# Patient Record
Sex: Female | Born: 1970 | Race: White | Hispanic: No | Marital: Married | Smoking: Never smoker
Health system: Southern US, Community
[De-identification: ages and names within clinical notes are randomized; demographics above are authoritative.]

## PROBLEM LIST (undated history)

## (undated) DIAGNOSIS — E079 Disorder of thyroid, unspecified: Secondary | ICD-10-CM

## (undated) HISTORY — PX: TYMPANOSTOMY TUBE PLACEMENT: SHX32

## (undated) HISTORY — DX: Disorder of thyroid, unspecified: E07.9

## (undated) HISTORY — PX: HYSTERECTOMY ABDOMINAL WITH SALPINGO-OOPHORECTOMY: SHX6792

---

## 2012-05-24 ENCOUNTER — Ambulatory Visit: Payer: BC Managed Care – PPO | Attending: Podiatry | Admitting: Physical Therapy

## 2012-05-24 DIAGNOSIS — M25676 Stiffness of unspecified foot, not elsewhere classified: Secondary | ICD-10-CM | POA: Insufficient documentation

## 2012-05-24 DIAGNOSIS — M25673 Stiffness of unspecified ankle, not elsewhere classified: Secondary | ICD-10-CM | POA: Insufficient documentation

## 2012-05-24 DIAGNOSIS — IMO0001 Reserved for inherently not codable concepts without codable children: Secondary | ICD-10-CM | POA: Insufficient documentation

## 2012-05-24 DIAGNOSIS — M25579 Pain in unspecified ankle and joints of unspecified foot: Secondary | ICD-10-CM | POA: Insufficient documentation

## 2012-05-30 ENCOUNTER — Ambulatory Visit: Payer: BC Managed Care – PPO | Admitting: Physical Therapy

## 2012-06-01 ENCOUNTER — Ambulatory Visit: Payer: BC Managed Care – PPO | Admitting: Physical Therapy

## 2012-06-06 ENCOUNTER — Ambulatory Visit: Payer: BC Managed Care – PPO | Admitting: Physical Therapy

## 2012-06-09 ENCOUNTER — Ambulatory Visit: Payer: BC Managed Care – PPO | Admitting: Physical Therapy

## 2012-06-13 ENCOUNTER — Ambulatory Visit: Payer: BC Managed Care – PPO | Admitting: Physical Therapy

## 2012-06-16 ENCOUNTER — Ambulatory Visit: Payer: BC Managed Care – PPO | Admitting: Physical Therapy

## 2012-06-20 ENCOUNTER — Ambulatory Visit: Payer: BC Managed Care – PPO | Attending: Podiatry | Admitting: Physical Therapy

## 2012-06-20 DIAGNOSIS — M25579 Pain in unspecified ankle and joints of unspecified foot: Secondary | ICD-10-CM | POA: Insufficient documentation

## 2012-06-20 DIAGNOSIS — IMO0001 Reserved for inherently not codable concepts without codable children: Secondary | ICD-10-CM | POA: Insufficient documentation

## 2012-06-20 DIAGNOSIS — M25676 Stiffness of unspecified foot, not elsewhere classified: Secondary | ICD-10-CM | POA: Insufficient documentation

## 2012-06-20 DIAGNOSIS — M25673 Stiffness of unspecified ankle, not elsewhere classified: Secondary | ICD-10-CM | POA: Insufficient documentation

## 2012-06-21 ENCOUNTER — Encounter: Payer: BC Managed Care – PPO | Admitting: Physical Therapy

## 2012-06-26 ENCOUNTER — Ambulatory Visit: Payer: BC Managed Care – PPO | Admitting: Physical Therapy

## 2012-06-28 ENCOUNTER — Ambulatory Visit: Payer: BC Managed Care – PPO | Admitting: Physical Therapy

## 2019-01-25 DIAGNOSIS — L918 Other hypertrophic disorders of the skin: Secondary | ICD-10-CM | POA: Diagnosis not present

## 2019-01-25 DIAGNOSIS — B351 Tinea unguium: Secondary | ICD-10-CM | POA: Diagnosis not present

## 2019-06-04 DIAGNOSIS — E063 Autoimmune thyroiditis: Secondary | ICD-10-CM | POA: Diagnosis not present

## 2019-06-04 DIAGNOSIS — Z8719 Personal history of other diseases of the digestive system: Secondary | ICD-10-CM | POA: Diagnosis not present

## 2019-06-04 DIAGNOSIS — Z79899 Other long term (current) drug therapy: Secondary | ICD-10-CM | POA: Diagnosis not present

## 2019-06-04 DIAGNOSIS — E038 Other specified hypothyroidism: Secondary | ICD-10-CM | POA: Diagnosis not present

## 2019-06-04 DIAGNOSIS — Z833 Family history of diabetes mellitus: Secondary | ICD-10-CM | POA: Diagnosis not present

## 2019-06-04 DIAGNOSIS — E663 Overweight: Secondary | ICD-10-CM | POA: Diagnosis not present

## 2019-06-04 DIAGNOSIS — E559 Vitamin D deficiency, unspecified: Secondary | ICD-10-CM | POA: Diagnosis not present

## 2019-06-04 DIAGNOSIS — K2970 Gastritis, unspecified, without bleeding: Secondary | ICD-10-CM | POA: Diagnosis not present

## 2019-06-06 DIAGNOSIS — R1011 Right upper quadrant pain: Secondary | ICD-10-CM | POA: Diagnosis not present

## 2019-06-06 DIAGNOSIS — R110 Nausea: Secondary | ICD-10-CM | POA: Diagnosis not present

## 2019-06-06 DIAGNOSIS — R1013 Epigastric pain: Secondary | ICD-10-CM | POA: Diagnosis not present

## 2019-06-06 DIAGNOSIS — K219 Gastro-esophageal reflux disease without esophagitis: Secondary | ICD-10-CM | POA: Diagnosis not present

## 2019-06-14 DIAGNOSIS — K7689 Other specified diseases of liver: Secondary | ICD-10-CM | POA: Diagnosis not present

## 2019-06-14 DIAGNOSIS — R1013 Epigastric pain: Secondary | ICD-10-CM | POA: Diagnosis not present

## 2019-06-14 DIAGNOSIS — K824 Cholesterolosis of gallbladder: Secondary | ICD-10-CM | POA: Diagnosis not present

## 2019-06-18 DIAGNOSIS — K2960 Other gastritis without bleeding: Secondary | ICD-10-CM | POA: Diagnosis not present

## 2019-06-18 DIAGNOSIS — Z7689 Persons encountering health services in other specified circumstances: Secondary | ICD-10-CM | POA: Diagnosis not present

## 2019-07-16 DIAGNOSIS — K2100 Gastro-esophageal reflux disease with esophagitis, without bleeding: Secondary | ICD-10-CM | POA: Diagnosis not present

## 2019-07-16 DIAGNOSIS — E663 Overweight: Secondary | ICD-10-CM | POA: Diagnosis not present

## 2019-08-13 DIAGNOSIS — K2100 Gastro-esophageal reflux disease with esophagitis, without bleeding: Secondary | ICD-10-CM | POA: Diagnosis not present

## 2019-08-13 DIAGNOSIS — Z7689 Persons encountering health services in other specified circumstances: Secondary | ICD-10-CM | POA: Diagnosis not present

## 2019-08-14 DIAGNOSIS — E038 Other specified hypothyroidism: Secondary | ICD-10-CM | POA: Diagnosis not present

## 2019-08-14 DIAGNOSIS — E063 Autoimmune thyroiditis: Secondary | ICD-10-CM | POA: Diagnosis not present

## 2019-08-20 ENCOUNTER — Emergency Department (INDEPENDENT_AMBULATORY_CARE_PROVIDER_SITE_OTHER)
Admission: EM | Admit: 2019-08-20 | Discharge: 2019-08-20 | Disposition: A | Discharge status: Home / Self Care | Payer: BC Managed Care – PPO | Source: Home / Self Care | Attending: Family Medicine | Admitting: Family Medicine

## 2019-08-20 ENCOUNTER — Other Ambulatory Visit: Payer: Self-pay

## 2019-08-20 DIAGNOSIS — U071 COVID-19: Secondary | ICD-10-CM

## 2019-08-20 LAB — POC SARS CORONAVIRUS 2 AG -  ED: SARS Coronavirus 2 Ag: POSITIVE — AB

## 2019-08-20 NOTE — Discharge Instructions (Addendum)
Take plain guaifenesin with plenty of water, for cough and congestion.  May add Pseudoephedrine (75m, one or two every 4 to 6 hours) for sinus congestion.  Get adequate rest.   May use Afrin nasal spray (or generic oxymetazoline) each morning for about 5 days and then discontinue.  Also recommend using saline nasal spray several times daily and saline nasal irrigation (AYR is a common brand).  Use Flonase nasal spray each morning after using Afrin nasal spray and saline nasal irrigation. Try warm salt water gargles for sore throat.  Stop all antihistamines for now, and other non-prescription cough/cold preparations. May take Ibuprofen 2063m 4 tabs every 8 hours with food for body aches, headache, etc. May take Delsym Cough Suppressant at bedtime for nighttime cough.   Isolate yourself until the below conditions are met: 1)  At least 7 days since symptoms onset. AND 2)  > 72 hours after symptom resolution (absence of fever without the use of fever-reducing medicine, and improvement in respiratory symptoms.

## 2019-08-20 NOTE — ED Provider Notes (Signed)
[facility]    CSN: [id number] Arrival date & time: 08/20/19  1144      History   Chief Complaint Chief Complaint  Patient presents with  . Fever  . Facial Pain    HPI Natividad Faulk is a 48 y.o. female.   Patient reports that she developed sinus congestion about two weeks ago but did not feel ill.  During the past week she has developed low grade fever with chills, sensitive skin, mild fatigue, and mild cough.  She also has developed loose stools, and believes that her ability to taste has changed.   She denies chest tightness and shortness of breath. Her husband became ill about 8 days ago and had a positive COVID19 test three days ago.    The history is provided by the patient.    History reviewed. No pertinent past medical history.  There are no active problems to display for this patient.   History reviewed. No pertinent surgical history.  OB History   No obstetric history on file.      Home Medications    Prior to Admission medications   Medication Sig Start Date End Date Taking? Authorizing Provider  estradiol (VIVELLE-DOT) 0.05 MG/24HR patch Place one patch on skin two times per week. 10/10/18  Yes [provider]  SYNTHROID 137 MCG tablet Take 137 mcg by mouth daily. 08/02/19   [provider]    Family History History reviewed. No pertinent family history.  Social History Social History   Tobacco Use  . Smoking status: Never Smoker  . Smokeless tobacco: Never Used  Substance Use Topics  . Alcohol use: Not on file  . Drug use: Not on file     Allergies   Levaquin [levofloxacin]   Review of Systems Review of Systems No sore throat + cough No pleuritic pain No wheezing + nasal congestion ? post-nasal drainage No sinus pain/pressure No itchy/red eyes No earache No hemoptysis No SOB No fever, + chills No nausea No vomiting No abdominal pain + mild diarrhea No urinary symptoms No skin rash + fatigue No  myalgias No headache Used OTC meds without relief   Physical Exam Triage Vital Signs ED Triage Vitals  Enc Vitals Group     BP 08/20/19 1228 (!) 139/92     Pulse Rate 08/20/19 1228 75     Resp 08/20/19 1228 18     Temp 08/20/19 1228 97.7 F (36.5 C)     Temp Source 08/20/19 1228 Oral     SpO2 08/20/19 1228 100 %     Weight --      Height --      Head Circumference --      Peak Flow --      Pain Score 08/20/19 1229 0     Pain Loc --      Pain Edu? --      Excl. in [place]? --    No data found.  Updated Vital Signs BP (!) 139/92 (BP Location: Left Arm)   Pulse 75   Temp 97.7 F (36.5 C) (Oral)   Resp 18   LMP  (LMP Unknown)   SpO2 100%   Visual Acuity Right Eye Distance:   Left Eye Distance:   Bilateral Distance:    Right Eye Near:   Left Eye Near:    Bilateral Near:     Physical Exam Nursing notes and Vital Signs reviewed. Appearance:  Patient appears stated age, and in no acute distress Eyes:  Pupils are equal, round, and reactive to light and accomodation.  Extraocular movement is intact.  Conjunctivae are not inflamed  Ears:  Canals normal.  Tympanic membranes normal.  Nose:  Mildly congested turbinates.  No sinus tenderness.  Pharynx:  Normal Neck:  Supple.  Mildly enlarged nontender lateral nodes are present.  Lungs:  Clear to auscultation.  Breath sounds are equal.  Moving air well. Heart:  Regular rate and rhythm without murmurs, rubs, or gallops.  Abdomen:  Nontender without masses or hepatosplenomegaly.  Bowel sounds are present.  No CVA or flank tenderness.  Extremities:  No edema.  Skin:  No rash present.   UC Treatments / Results  Labs (all labs ordered are listed, but only abnormal results are displayed) Labs Reviewed  POC SARS CORONAVIRUS 2 AG -  ED - Abnormal; Notable for the following components:      Result Value   SARS Coronavirus 2 Ag Positive (*)    All other components within normal limits    EKG   Radiology No results found.   Procedures Procedures (including critical care time)  Medications Ordered in UC Medications - No data to display  Initial Impression / Assessment and Plan / UC Course  I have reviewed the triage vital signs and the nursing notes.  Pertinent labs & imaging results that were available during my care of the patient were reviewed by me and considered in my medical decision making (see chart for details).    There is no evidence of bacterial infection today.  Treat symptomatically for now  If symptoms become significantly worse during the night or over the weekend, proceed to the local emergency room.   Final Clinical Impressions(s) / UC Diagnoses   Final diagnoses:  [id number] virus infection     Discharge Instructions     Take plain guaifenesin with plenty of water, for cough and congestion.  May add Pseudoephedrine (63m, one or two every 4 to 6 hours) for sinus congestion.  Get adequate rest.   May use Afrin nasal spray (or generic oxymetazoline) each morning for about 5 days and then discontinue.  Also recommend using saline nasal spray several times daily and saline nasal irrigation (AYR is a common brand).  Use Flonase nasal spray each morning after using Afrin nasal spray and saline nasal irrigation. Try warm salt water gargles for sore throat.  Stop all antihistamines for now, and other non-prescription cough/cold preparations. May take Ibuprofen 2060m 4 tabs every 8 hours with food for body aches, headache, etc. May take Delsym Cough Suppressant at bedtime for nighttime cough.   Isolate yourself until the below conditions are met: 1)  At least 7 days since symptoms onset. AND 2)  > 72 hours after symptom resolution (absence of fever without the use of fever-reducing medicine, and improvement in respiratory symptoms.        ED Prescriptions    None        BeKandra NicolasMD 08/23/19 1143

## 2019-08-20 NOTE — ED Triage Notes (Signed)
Pt c/o sinus issues x 2 weeks. Low grade fever and cough since Thurs. Husband tested pos for covid on Friday.

## 2019-09-25 DIAGNOSIS — Z1231 Encounter for screening mammogram for malignant neoplasm of breast: Secondary | ICD-10-CM | POA: Diagnosis not present

## 2019-10-03 DIAGNOSIS — N6011 Diffuse cystic mastopathy of right breast: Secondary | ICD-10-CM | POA: Diagnosis not present

## 2019-10-03 DIAGNOSIS — N6001 Solitary cyst of right breast: Secondary | ICD-10-CM | POA: Diagnosis not present

## 2019-10-03 DIAGNOSIS — R922 Inconclusive mammogram: Secondary | ICD-10-CM | POA: Diagnosis not present

## 2019-10-12 DIAGNOSIS — Z90710 Acquired absence of both cervix and uterus: Secondary | ICD-10-CM | POA: Diagnosis not present

## 2019-10-12 DIAGNOSIS — Z01419 Encounter for gynecological examination (general) (routine) without abnormal findings: Secondary | ICD-10-CM | POA: Diagnosis not present

## 2019-10-12 DIAGNOSIS — Z8541 Personal history of malignant neoplasm of cervix uteri: Secondary | ICD-10-CM | POA: Diagnosis not present

## 2019-10-12 DIAGNOSIS — N951 Menopausal and female climacteric states: Secondary | ICD-10-CM | POA: Diagnosis not present

## 2019-10-12 DIAGNOSIS — Z1272 Encounter for screening for malignant neoplasm of vagina: Secondary | ICD-10-CM | POA: Diagnosis not present

## 2019-10-22 DIAGNOSIS — Z0000 Encounter for general adult medical examination without abnormal findings: Secondary | ICD-10-CM | POA: Diagnosis not present

## 2020-04-02 DIAGNOSIS — N6011 Diffuse cystic mastopathy of right breast: Secondary | ICD-10-CM | POA: Diagnosis not present

## 2020-04-02 DIAGNOSIS — R922 Inconclusive mammogram: Secondary | ICD-10-CM | POA: Diagnosis not present

## 2020-05-21 DIAGNOSIS — J300 Vasomotor rhinitis: Secondary | ICD-10-CM | POA: Diagnosis not present

## 2020-05-21 DIAGNOSIS — H6980 Other specified disorders of Eustachian tube, unspecified ear: Secondary | ICD-10-CM | POA: Diagnosis not present

## 2020-05-22 DIAGNOSIS — L821 Other seborrheic keratosis: Secondary | ICD-10-CM | POA: Diagnosis not present

## 2020-05-22 DIAGNOSIS — L814 Other melanin hyperpigmentation: Secondary | ICD-10-CM | POA: Diagnosis not present

## 2020-05-22 DIAGNOSIS — L700 Acne vulgaris: Secondary | ICD-10-CM | POA: Diagnosis not present

## 2020-05-22 DIAGNOSIS — L853 Xerosis cutis: Secondary | ICD-10-CM | POA: Diagnosis not present

## 2020-06-03 DIAGNOSIS — E038 Other specified hypothyroidism: Secondary | ICD-10-CM | POA: Diagnosis not present

## 2020-06-03 DIAGNOSIS — E559 Vitamin D deficiency, unspecified: Secondary | ICD-10-CM | POA: Diagnosis not present

## 2020-06-03 DIAGNOSIS — E063 Autoimmune thyroiditis: Secondary | ICD-10-CM | POA: Diagnosis not present

## 2020-06-03 DIAGNOSIS — Z833 Family history of diabetes mellitus: Secondary | ICD-10-CM | POA: Diagnosis not present

## 2020-09-18 DIAGNOSIS — L719 Rosacea, unspecified: Secondary | ICD-10-CM | POA: Diagnosis not present

## 2020-09-18 DIAGNOSIS — L814 Other melanin hyperpigmentation: Secondary | ICD-10-CM | POA: Diagnosis not present

## 2020-10-14 DIAGNOSIS — Z1272 Encounter for screening for malignant neoplasm of vagina: Secondary | ICD-10-CM | POA: Diagnosis not present

## 2020-10-14 DIAGNOSIS — N6001 Solitary cyst of right breast: Secondary | ICD-10-CM | POA: Diagnosis not present

## 2020-10-14 DIAGNOSIS — Z01419 Encounter for gynecological examination (general) (routine) without abnormal findings: Secondary | ICD-10-CM | POA: Diagnosis not present

## 2020-10-14 DIAGNOSIS — Z1231 Encounter for screening mammogram for malignant neoplasm of breast: Secondary | ICD-10-CM | POA: Diagnosis not present

## 2020-10-14 DIAGNOSIS — Z90710 Acquired absence of both cervix and uterus: Secondary | ICD-10-CM | POA: Diagnosis not present

## 2020-10-22 DIAGNOSIS — N6001 Solitary cyst of right breast: Secondary | ICD-10-CM | POA: Diagnosis not present

## 2020-10-22 DIAGNOSIS — R922 Inconclusive mammogram: Secondary | ICD-10-CM | POA: Diagnosis not present

## 2020-10-22 DIAGNOSIS — R928 Other abnormal and inconclusive findings on diagnostic imaging of breast: Secondary | ICD-10-CM | POA: Diagnosis not present

## 2020-10-23 DIAGNOSIS — Z1389 Encounter for screening for other disorder: Secondary | ICD-10-CM | POA: Diagnosis not present

## 2020-10-23 DIAGNOSIS — Z13220 Encounter for screening for lipoid disorders: Secondary | ICD-10-CM | POA: Diagnosis not present

## 2020-10-23 DIAGNOSIS — Z1329 Encounter for screening for other suspected endocrine disorder: Secondary | ICD-10-CM | POA: Diagnosis not present

## 2020-10-23 DIAGNOSIS — Z130 Encounter for screening for diseases of the blood and blood-forming organs and certain disorders involving the immune mechanism: Secondary | ICD-10-CM | POA: Diagnosis not present

## 2020-10-23 DIAGNOSIS — Z13228 Encounter for screening for other metabolic disorders: Secondary | ICD-10-CM | POA: Diagnosis not present

## 2020-10-23 DIAGNOSIS — Z0000 Encounter for general adult medical examination without abnormal findings: Secondary | ICD-10-CM | POA: Diagnosis not present

## 2020-10-23 DIAGNOSIS — E559 Vitamin D deficiency, unspecified: Secondary | ICD-10-CM | POA: Diagnosis not present

## 2021-01-05 ENCOUNTER — Ambulatory Visit (INDEPENDENT_AMBULATORY_CARE_PROVIDER_SITE_OTHER): Payer: BC Managed Care – PPO

## 2021-01-05 ENCOUNTER — Other Ambulatory Visit: Payer: Self-pay

## 2021-01-05 ENCOUNTER — Ambulatory Visit: Payer: BC Managed Care – PPO | Admitting: Sports Medicine

## 2021-01-05 DIAGNOSIS — M7989 Other specified soft tissue disorders: Secondary | ICD-10-CM | POA: Diagnosis not present

## 2021-01-05 DIAGNOSIS — Z09 Encounter for follow-up examination after completed treatment for conditions other than malignant neoplasm: Secondary | ICD-10-CM

## 2021-01-05 DIAGNOSIS — M2241 Chondromalacia patellae, right knee: Secondary | ICD-10-CM

## 2021-01-05 DIAGNOSIS — S8991XA Unspecified injury of right lower leg, initial encounter: Secondary | ICD-10-CM | POA: Diagnosis not present

## 2021-01-05 DIAGNOSIS — R600 Localized edema: Secondary | ICD-10-CM | POA: Diagnosis not present

## 2021-01-05 DIAGNOSIS — M1712 Unilateral primary osteoarthritis, left knee: Secondary | ICD-10-CM | POA: Diagnosis not present

## 2021-01-05 DIAGNOSIS — M1711 Unilateral primary osteoarthritis, right knee: Secondary | ICD-10-CM | POA: Diagnosis not present

## 2021-01-05 DIAGNOSIS — M7121 Synovial cyst of popliteal space [Baker], right knee: Secondary | ICD-10-CM | POA: Diagnosis not present

## 2021-01-05 MED ORDER — MELOXICAM 15 MG PO TABS: ORAL_TABLET | ORAL | 3 refills | Status: DC

## 2021-01-05 NOTE — Assessment & Plan Note (Signed)
This pleasant 50 year old female was hit on the outside of her right knee by her dog about 2 weeks ago, she had some swelling. Unfortunately she continues to have pain posterior/medial joint line, mechanical symptoms including inability to fully extend her knee. On exam she has a mild effusion, ACL is intact with a negative Lachman sign however she does have a positive sag sign, and mildly positive McMurray sign, she also has pain with valgus stress and approximately 1 cm opening of the medial joint line.  I do have concern for injury to her PCL, MCL, and meniscus, adding meloxicam, x-rays, MRI today.

## 2021-01-05 NOTE — Progress Notes (Signed)
    Procedures performed today:    None.  Independent interpretation of notes and tests performed by another provider:   None.  Brief History, Exam, Impression, and Recommendations:    Right knee injury This pleasant 50 year old female was hit on the outside of her right knee by her dog about 2 weeks ago, she had some swelling. Unfortunately she continues to have pain posterior/medial joint line, mechanical symptoms including inability to fully extend her knee. On exam she has a mild effusion, ACL is intact with a negative Lachman sign however she does have a positive sag sign, and mildly positive McMurray sign, she also has pain with valgus stress and approximately 1 cm opening of the medial joint line.  I do have concern for injury to her PCL, MCL, and meniscus, adding meloxicam, x-rays, MRI today.     ___________________________________________ Ihor Austin. Benjamin Stain, M.D., ABFM., CAQSM. Primary Care and Sports Medicine [facility]  Adjunct Instructor of Family Medicine  [employer]

## 2021-05-12 DIAGNOSIS — F411 Generalized anxiety disorder: Secondary | ICD-10-CM | POA: Diagnosis not present

## 2021-05-12 DIAGNOSIS — K219 Gastro-esophageal reflux disease without esophagitis: Secondary | ICD-10-CM | POA: Diagnosis not present

## 2021-05-12 DIAGNOSIS — K589 Irritable bowel syndrome without diarrhea: Secondary | ICD-10-CM | POA: Diagnosis not present

## 2021-05-12 DIAGNOSIS — E039 Hypothyroidism, unspecified: Secondary | ICD-10-CM | POA: Diagnosis not present

## 2021-05-15 DIAGNOSIS — R928 Other abnormal and inconclusive findings on diagnostic imaging of breast: Secondary | ICD-10-CM | POA: Diagnosis not present

## 2021-05-15 DIAGNOSIS — N6011 Diffuse cystic mastopathy of right breast: Secondary | ICD-10-CM | POA: Diagnosis not present

## 2021-06-04 DIAGNOSIS — E063 Autoimmune thyroiditis: Secondary | ICD-10-CM | POA: Diagnosis not present

## 2021-06-04 DIAGNOSIS — E038 Other specified hypothyroidism: Secondary | ICD-10-CM | POA: Diagnosis not present

## 2021-08-20 ENCOUNTER — Ambulatory Visit (INDEPENDENT_AMBULATORY_CARE_PROVIDER_SITE_OTHER): Payer: BC Managed Care – PPO

## 2021-08-20 ENCOUNTER — Ambulatory Visit: Payer: BC Managed Care – PPO | Admitting: Sports Medicine

## 2021-08-20 ENCOUNTER — Other Ambulatory Visit: Payer: Self-pay

## 2021-08-20 DIAGNOSIS — M7501 Adhesive capsulitis of right shoulder: Secondary | ICD-10-CM | POA: Diagnosis not present

## 2021-08-20 DIAGNOSIS — S4991XA Unspecified injury of right shoulder and upper arm, initial encounter: Secondary | ICD-10-CM | POA: Diagnosis not present

## 2021-08-20 NOTE — Assessment & Plan Note (Signed)
This is a very pleasant 50 year old female, she has been doing a lot of working out, over the past several months she is noted increasing discomfort in her right shoulder with loss of motion. On exam she has external rotation to only about 30 degrees compared to 90 degrees on the contralateral side, abduction to about 20 to 30 degrees, compared to 90 on the contralateral side. These findings are consistent with glenohumeral osteoarthritis versus glenohumeral adhesive capsulitis. Today we injected her glenohumeral joint, I would like her to return to [facility] for additional therapy. X-rays. Return to see me in 6 weeks.

## 2021-08-20 NOTE — Progress Notes (Signed)
    Procedures performed today:    Procedure: Real-time Ultrasound Guided injection of the right glenohumeral joint Device: Samsung HS60  Verbal informed consent obtained.  Time-out conducted.  Noted no overlying erythema, induration, or other signs of local infection.  Skin prepped in a sterile fashion.  Local anesthesia: Topical Ethyl chloride.  With sterile technique and under real time ultrasound guidance: Noted normal-appearing joint, 1 cc Kenalog 40, 2 cc lidocaine, 2 cc bupivacaine injected easily Completed without difficulty  Advised to call if fevers/chills, erythema, induration, drainage, or persistent bleeding.  Images permanently stored and available for review in PACS.  Impression: Technically successful ultrasound guided injection.  Independent interpretation of notes and tests performed by another provider:   None.  Brief History, Exam, Impression, and Recommendations:    Adhesive capsulitis of right shoulder This is a very pleasant 50 year old female, she has been doing a lot of working out, over the past several months she is noted increasing discomfort in her right shoulder with loss of motion. On exam she has external rotation to only about 30 degrees compared to 90 degrees on the contralateral side, abduction to about 20 to 30 degrees, compared to 90 on the contralateral side. These findings are consistent with glenohumeral osteoarthritis versus glenohumeral adhesive capsulitis. Today we injected her glenohumeral joint, I would like her to return to [facility] for additional therapy. X-rays. Return to see me in 6 weeks.    ___________________________________________ Ihor Austin. Benjamin Stain, M.D., ABFM., CAQSM. Primary Care and Sports Medicine [facility]  Adjunct Instructor of Family Medicine  [employer]

## 2021-08-27 DIAGNOSIS — M25611 Stiffness of right shoulder, not elsewhere classified: Secondary | ICD-10-CM | POA: Diagnosis not present

## 2021-08-27 DIAGNOSIS — M6281 Muscle weakness (generalized): Secondary | ICD-10-CM | POA: Diagnosis not present

## 2021-08-27 DIAGNOSIS — M25511 Pain in right shoulder: Secondary | ICD-10-CM | POA: Diagnosis not present

## 2021-08-31 DIAGNOSIS — M6281 Muscle weakness (generalized): Secondary | ICD-10-CM | POA: Diagnosis not present

## 2021-08-31 DIAGNOSIS — M25611 Stiffness of right shoulder, not elsewhere classified: Secondary | ICD-10-CM | POA: Diagnosis not present

## 2021-08-31 DIAGNOSIS — M25511 Pain in right shoulder: Secondary | ICD-10-CM | POA: Diagnosis not present

## 2021-09-02 IMAGING — MR MR KNEE*R* W/O CM
7 series · 40 of 40 positions shown · non-contrast
Comparison: None.

CLINICAL DATA: Right knee injury pain and swelling over the last 2
weeks

EXAM:
MRI OF THE RIGHT KNEE WITHOUT CONTRAST
TECHNIQUE: Multiplanar, multisequence MR imaging of the knee was performed. No
intravenous contrast was administered.

[Series 3: T2 fat-sat · axial · 4.0mm · 0.53mm/px · z∈[-88,+82]mm · 6 of 35 slices shown (1 of 3)]
[im 1/35]
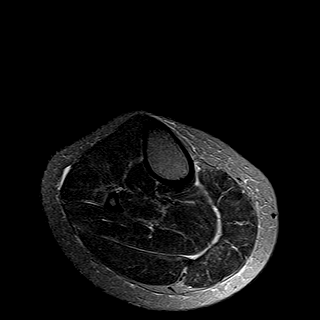
[im 7/35]
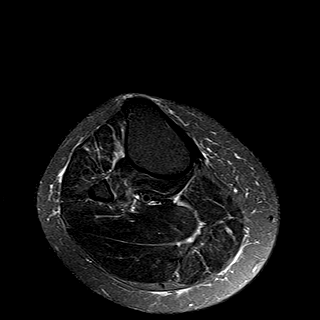
[im 14/35]
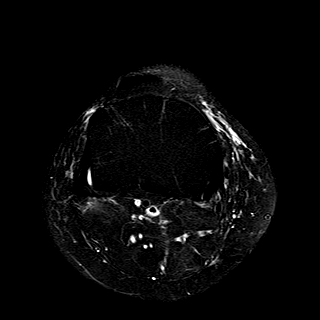
[im 21/35]
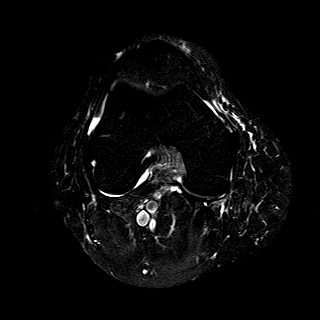
[im 28/35]
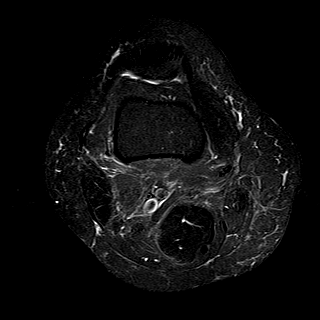
[im 35/35]
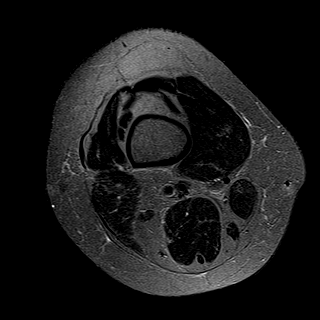

[Series 4: T1 · coronal · 4.0mm · 0.62mm/px · 6 of 31 slices shown]
[im 1/31]
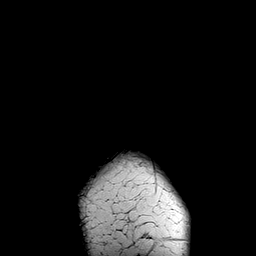
[im 7/31]
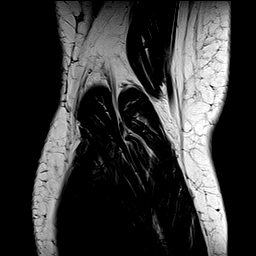
[im 13/31]
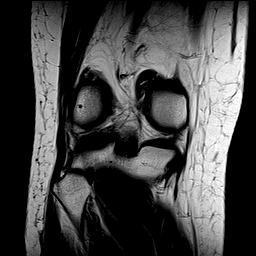
[im 19/31]
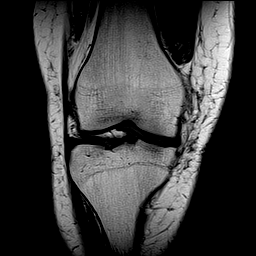
[im 25/31]
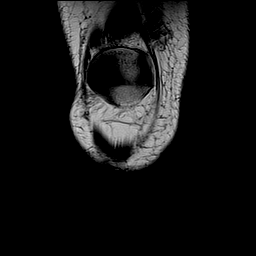
[im 31/31]
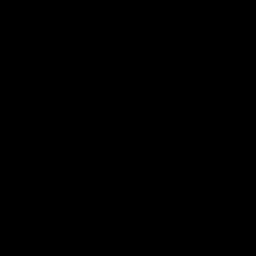

[Series 5: T2 fat-sat · coronal · 4.0mm · 0.62mm/px · 6 of 31 slices shown (2 of 3)]
[im 1/31]
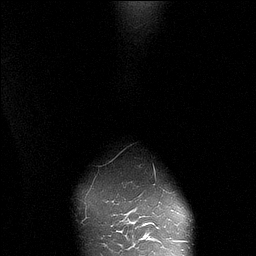
[im 7/31]
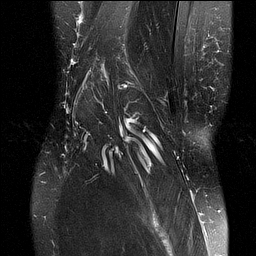
[im 13/31]
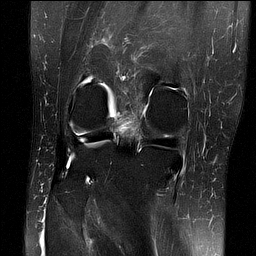
[im 19/31]
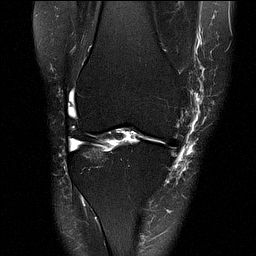
[im 25/31]
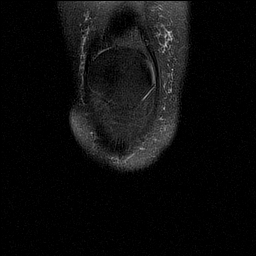
[im 31/31]
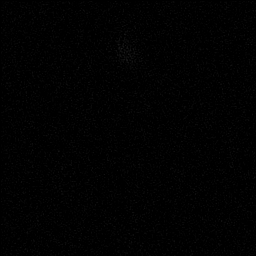

[Series 6: PD fat-sat · coronal · 4.0mm · 0.62mm/px · 6 of 31 slices shown (1 of 3)]
[im 1/31]
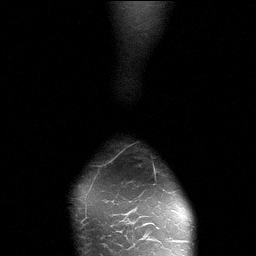
[im 7/31]
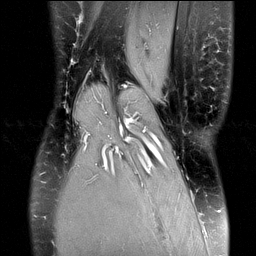
[im 13/31]
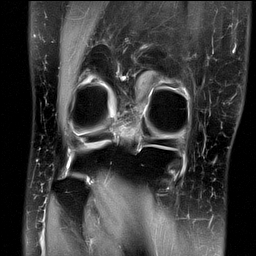
[im 19/31]
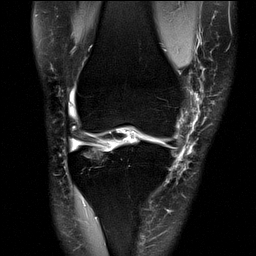
[im 25/31]
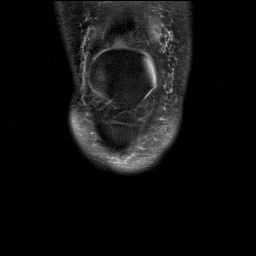
[im 31/31]
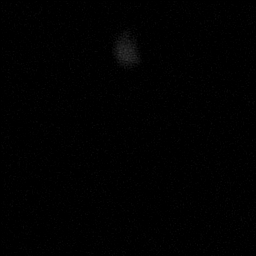

[Series 7: PD fat-sat · sagittal · 3.0mm · 0.62mm/px · 6 of 35 slices shown (2 of 3)]
[im 1/35]
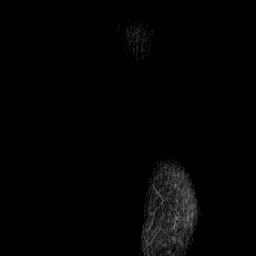
[im 7/35]
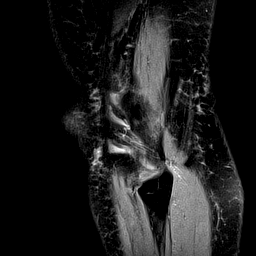
[im 14/35]
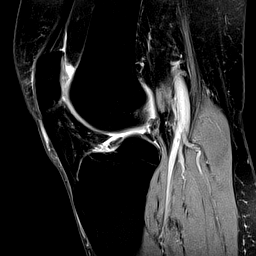
[im 21/35]
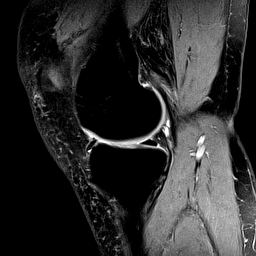
[im 28/35]
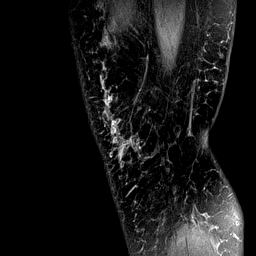
[im 35/35]
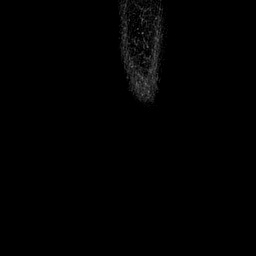

[Series 8: T2 fat-sat · sagittal · 3.0mm · 0.62mm/px · 6 of 35 slices shown (3 of 3)]
[im 1/35]
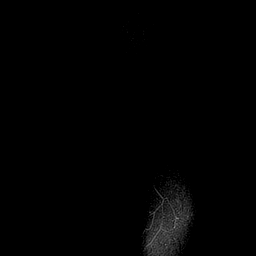
[im 7/35]
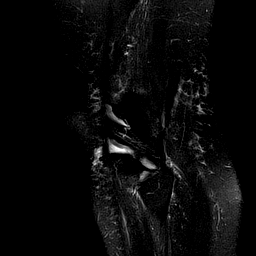
[im 14/35]
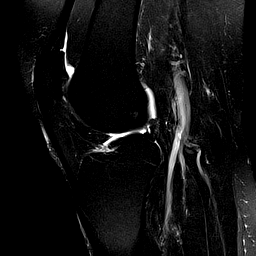
[im 21/35]
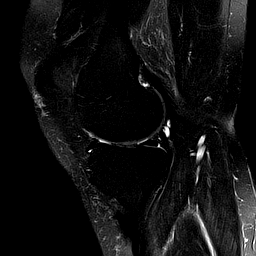
[im 28/35]
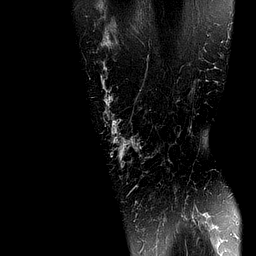
[im 35/35]
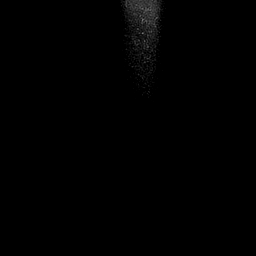

[Series 9: PD fat-sat · coronal · 2.0mm · 0.62mm/px · 4 of 19 slices shown (3 of 3)]
[im 1/19]
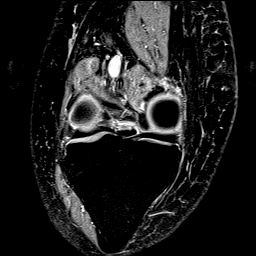
[im 7/19]
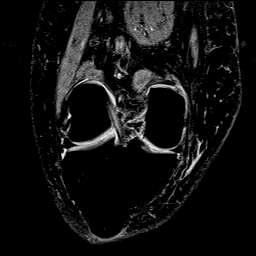
[im 13/19]
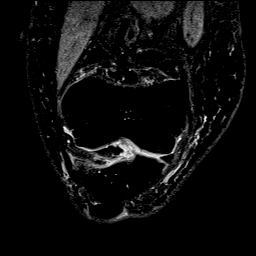
[im 19/19]
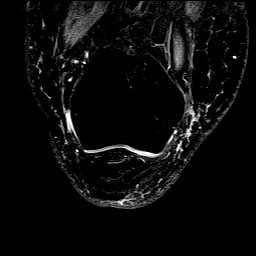

[40 of 40 positions shown; findings below may reference images not displayed]

FINDINGS: MENISCI

Medial meniscus:  Unremarkable

Lateral meniscus:  Unremarkable

LIGAMENTS

Cruciates:  Unremarkable

Collaterals:  Unremarkable

CARTILAGE

Patellofemoral: Chondral heterogeneity favoring mild patellar
chondromalacia.

Medial:  Moderate degenerative chondral thinning.

Lateral:  Unremarkable

Joint: Upper normal amount of fluid in the knee joint. Medial plica.

Popliteal Fossa:  Very small Baker's cyst.

Extensor Mechanism:  Unremarkable

Bones: Small focus of marrow edema anteriorly along the lateral
tibial plateau on image 11 of series 7.

Other: No supplemental non-categorized findings.
IMPRESSION: 1. No meniscal tear is identified.
2. Small focus of marrow edema anteriorly along the lateral tibial
plateau, possibly a direct injury, but without underlying fracture
observed.
3. Moderate degenerative chondral thinning in the medial
compartment.
4. Mild patellar chondromalacia.
5. Very small Baker's cyst.

## 2021-09-02 IMAGING — DX DG KNEE 1-2V*L*
2 series · 2 of 2 positions shown · non-contrast
Comparison: None.

CLINICAL DATA: Knee pain

EXAM:
LEFT KNEE - 1-2 VIEW

[tunnel]
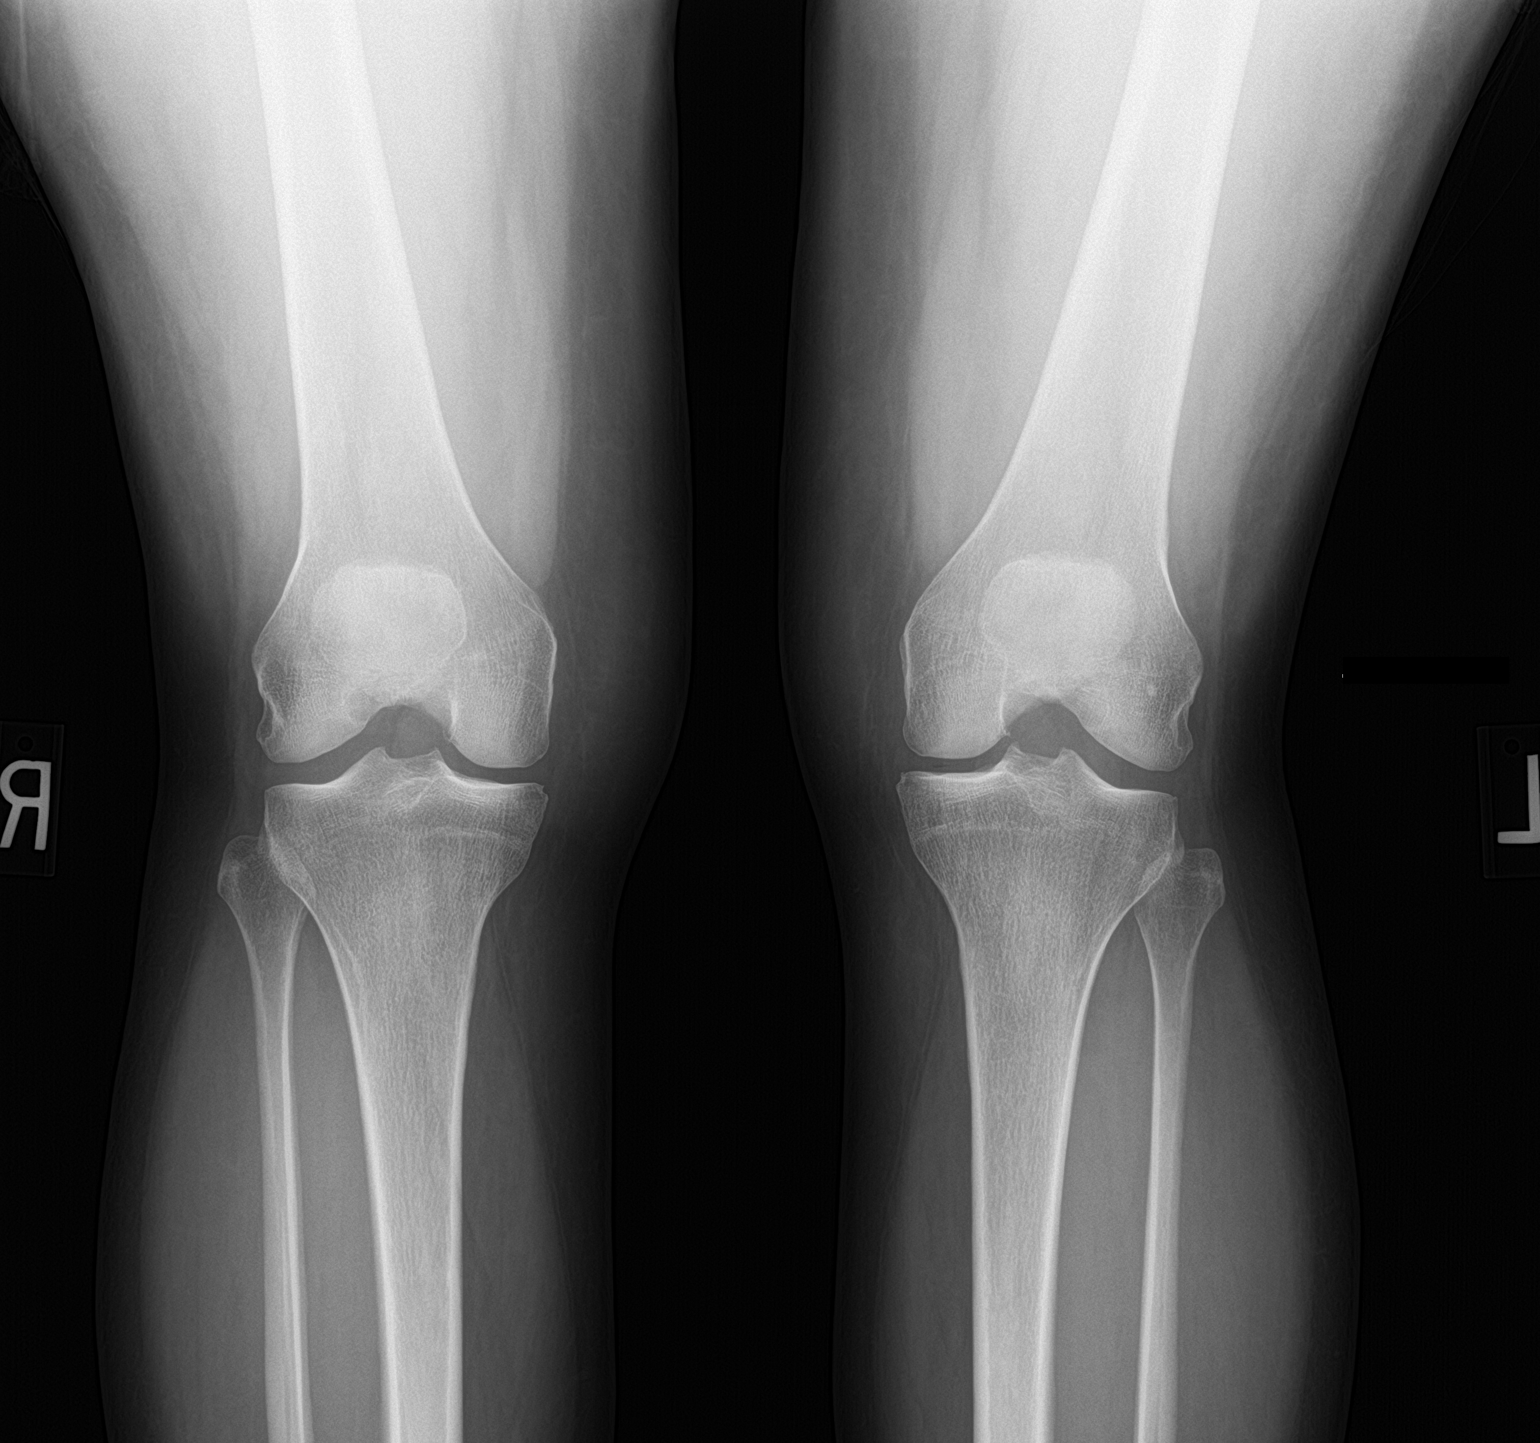

[knee ap bilat standing]
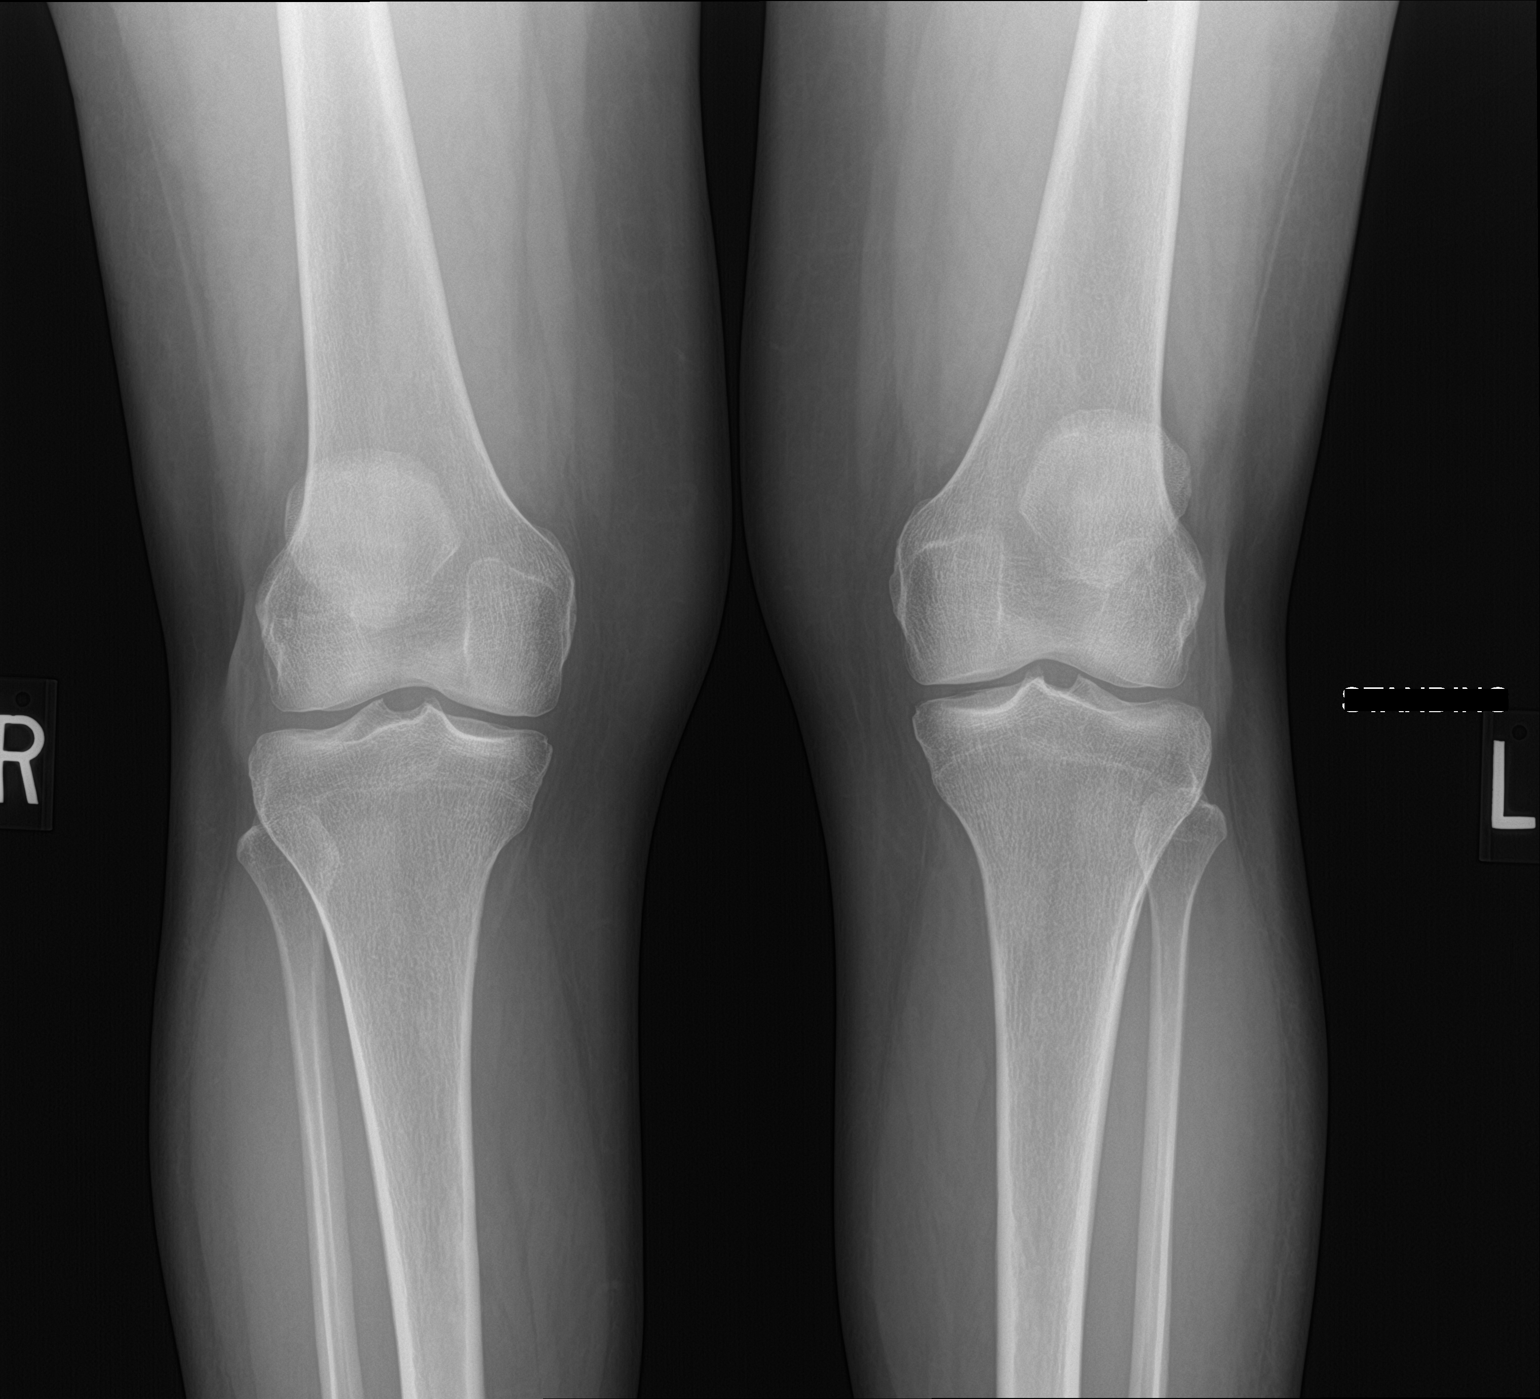

[2 of 2 positions shown; findings below may reference images not displayed]

FINDINGS: Mild medial compartmental articular space narrowing. Mild spurring
of the tibial spine.
IMPRESSION: 1. Mild degenerative findings including medial compartmental
articular space narrowing and spurring of the tibial spine.

## 2021-09-03 DIAGNOSIS — M25511 Pain in right shoulder: Secondary | ICD-10-CM | POA: Diagnosis not present

## 2021-09-03 DIAGNOSIS — M25611 Stiffness of right shoulder, not elsewhere classified: Secondary | ICD-10-CM | POA: Diagnosis not present

## 2021-09-03 DIAGNOSIS — M6281 Muscle weakness (generalized): Secondary | ICD-10-CM | POA: Diagnosis not present

## 2021-09-09 DIAGNOSIS — M25511 Pain in right shoulder: Secondary | ICD-10-CM | POA: Diagnosis not present

## 2021-09-09 DIAGNOSIS — M25611 Stiffness of right shoulder, not elsewhere classified: Secondary | ICD-10-CM | POA: Diagnosis not present

## 2021-09-09 DIAGNOSIS — M6281 Muscle weakness (generalized): Secondary | ICD-10-CM | POA: Diagnosis not present

## 2021-09-10 DIAGNOSIS — M25511 Pain in right shoulder: Secondary | ICD-10-CM | POA: Diagnosis not present

## 2021-09-10 DIAGNOSIS — M6281 Muscle weakness (generalized): Secondary | ICD-10-CM | POA: Diagnosis not present

## 2021-09-10 DIAGNOSIS — M25611 Stiffness of right shoulder, not elsewhere classified: Secondary | ICD-10-CM | POA: Diagnosis not present

## 2021-09-15 DIAGNOSIS — M25511 Pain in right shoulder: Secondary | ICD-10-CM | POA: Diagnosis not present

## 2021-09-15 DIAGNOSIS — M25611 Stiffness of right shoulder, not elsewhere classified: Secondary | ICD-10-CM | POA: Diagnosis not present

## 2021-09-15 DIAGNOSIS — M6281 Muscle weakness (generalized): Secondary | ICD-10-CM | POA: Diagnosis not present

## 2021-09-18 DIAGNOSIS — M25511 Pain in right shoulder: Secondary | ICD-10-CM | POA: Diagnosis not present

## 2021-09-18 DIAGNOSIS — M6281 Muscle weakness (generalized): Secondary | ICD-10-CM | POA: Diagnosis not present

## 2021-09-18 DIAGNOSIS — M25611 Stiffness of right shoulder, not elsewhere classified: Secondary | ICD-10-CM | POA: Diagnosis not present

## 2021-09-22 DIAGNOSIS — M25511 Pain in right shoulder: Secondary | ICD-10-CM | POA: Diagnosis not present

## 2021-09-22 DIAGNOSIS — M25611 Stiffness of right shoulder, not elsewhere classified: Secondary | ICD-10-CM | POA: Diagnosis not present

## 2021-09-22 DIAGNOSIS — M6281 Muscle weakness (generalized): Secondary | ICD-10-CM | POA: Diagnosis not present

## 2021-09-24 DIAGNOSIS — M6281 Muscle weakness (generalized): Secondary | ICD-10-CM | POA: Diagnosis not present

## 2021-09-24 DIAGNOSIS — M25611 Stiffness of right shoulder, not elsewhere classified: Secondary | ICD-10-CM | POA: Diagnosis not present

## 2021-09-24 DIAGNOSIS — M25511 Pain in right shoulder: Secondary | ICD-10-CM | POA: Diagnosis not present

## 2021-09-29 DIAGNOSIS — M25511 Pain in right shoulder: Secondary | ICD-10-CM | POA: Diagnosis not present

## 2021-09-29 DIAGNOSIS — M25611 Stiffness of right shoulder, not elsewhere classified: Secondary | ICD-10-CM | POA: Diagnosis not present

## 2021-09-29 DIAGNOSIS — M6281 Muscle weakness (generalized): Secondary | ICD-10-CM | POA: Diagnosis not present

## 2021-10-01 ENCOUNTER — Ambulatory Visit: Payer: BC Managed Care – PPO | Admitting: Sports Medicine

## 2021-10-01 ENCOUNTER — Other Ambulatory Visit: Payer: Self-pay

## 2021-10-01 DIAGNOSIS — M6281 Muscle weakness (generalized): Secondary | ICD-10-CM | POA: Diagnosis not present

## 2021-10-01 DIAGNOSIS — M25511 Pain in right shoulder: Secondary | ICD-10-CM | POA: Diagnosis not present

## 2021-10-01 DIAGNOSIS — M25611 Stiffness of right shoulder, not elsewhere classified: Secondary | ICD-10-CM | POA: Diagnosis not present

## 2021-10-01 DIAGNOSIS — M7501 Adhesive capsulitis of right shoulder: Secondary | ICD-10-CM | POA: Diagnosis not present

## 2021-10-01 NOTE — Progress Notes (Signed)
° ° °  Procedures performed today:    None.  Independent interpretation of notes and tests performed by another provider:   None.  Brief History, Exam, Impression, and Recommendations:    Adhesive capsulitis of right shoulder Fantastic improvements in motion after glenohumeral injection at the last visit, physical therapy continues, she continues to improve her motion, return as needed.    ___________________________________________ Ihor Austin. Benjamin Stain, M.D., ABFM., CAQSM. Primary Care and Sports Medicine [facility]  Adjunct Instructor of Family Medicine  [employer]

## 2021-10-01 NOTE — Assessment & Plan Note (Signed)
Fantastic improvements in motion after glenohumeral injection at the last visit, physical therapy continues, she continues to improve her motion, return as needed.

## 2021-10-05 DIAGNOSIS — M25611 Stiffness of right shoulder, not elsewhere classified: Secondary | ICD-10-CM | POA: Diagnosis not present

## 2021-10-05 DIAGNOSIS — M6281 Muscle weakness (generalized): Secondary | ICD-10-CM | POA: Diagnosis not present

## 2021-10-05 DIAGNOSIS — M25511 Pain in right shoulder: Secondary | ICD-10-CM | POA: Diagnosis not present

## 2021-10-12 DIAGNOSIS — M25611 Stiffness of right shoulder, not elsewhere classified: Secondary | ICD-10-CM | POA: Diagnosis not present

## 2021-10-12 DIAGNOSIS — M6281 Muscle weakness (generalized): Secondary | ICD-10-CM | POA: Diagnosis not present

## 2021-10-12 DIAGNOSIS — M25511 Pain in right shoulder: Secondary | ICD-10-CM | POA: Diagnosis not present

## 2021-10-14 DIAGNOSIS — M25511 Pain in right shoulder: Secondary | ICD-10-CM | POA: Diagnosis not present

## 2021-10-14 DIAGNOSIS — M6281 Muscle weakness (generalized): Secondary | ICD-10-CM | POA: Diagnosis not present

## 2021-10-14 DIAGNOSIS — M25611 Stiffness of right shoulder, not elsewhere classified: Secondary | ICD-10-CM | POA: Diagnosis not present

## 2021-10-19 DIAGNOSIS — M25611 Stiffness of right shoulder, not elsewhere classified: Secondary | ICD-10-CM | POA: Diagnosis not present

## 2021-10-19 DIAGNOSIS — M25511 Pain in right shoulder: Secondary | ICD-10-CM | POA: Diagnosis not present

## 2021-10-19 DIAGNOSIS — M6281 Muscle weakness (generalized): Secondary | ICD-10-CM | POA: Diagnosis not present

## 2021-10-28 DIAGNOSIS — M6281 Muscle weakness (generalized): Secondary | ICD-10-CM | POA: Diagnosis not present

## 2021-10-28 DIAGNOSIS — M25511 Pain in right shoulder: Secondary | ICD-10-CM | POA: Diagnosis not present

## 2021-10-28 DIAGNOSIS — M25611 Stiffness of right shoulder, not elsewhere classified: Secondary | ICD-10-CM | POA: Diagnosis not present

## 2021-10-29 DIAGNOSIS — Z8542 Personal history of malignant neoplasm of other parts of uterus: Secondary | ICD-10-CM | POA: Diagnosis not present

## 2021-10-29 DIAGNOSIS — Z5181 Encounter for therapeutic drug level monitoring: Secondary | ICD-10-CM | POA: Diagnosis not present

## 2021-10-29 DIAGNOSIS — E8941 Symptomatic postprocedural ovarian failure: Secondary | ICD-10-CM | POA: Diagnosis not present

## 2021-10-29 DIAGNOSIS — Z1272 Encounter for screening for malignant neoplasm of vagina: Secondary | ICD-10-CM | POA: Diagnosis not present

## 2021-10-29 DIAGNOSIS — Z01419 Encounter for gynecological examination (general) (routine) without abnormal findings: Secondary | ICD-10-CM | POA: Diagnosis not present

## 2021-10-29 DIAGNOSIS — N951 Menopausal and female climacteric states: Secondary | ICD-10-CM | POA: Diagnosis not present

## 2021-11-02 DIAGNOSIS — E7211 Homocystinuria: Secondary | ICD-10-CM | POA: Diagnosis not present

## 2021-11-02 DIAGNOSIS — E559 Vitamin D deficiency, unspecified: Secondary | ICD-10-CM | POA: Diagnosis not present

## 2021-11-02 DIAGNOSIS — E039 Hypothyroidism, unspecified: Secondary | ICD-10-CM | POA: Diagnosis not present

## 2021-11-02 DIAGNOSIS — Z1321 Encounter for screening for nutritional disorder: Secondary | ICD-10-CM | POA: Diagnosis not present

## 2021-11-02 DIAGNOSIS — D72819 Decreased white blood cell count, unspecified: Secondary | ICD-10-CM | POA: Diagnosis not present

## 2021-11-02 DIAGNOSIS — Z1159 Encounter for screening for other viral diseases: Secondary | ICD-10-CM | POA: Diagnosis not present

## 2021-11-02 DIAGNOSIS — F411 Generalized anxiety disorder: Secondary | ICD-10-CM | POA: Diagnosis not present

## 2021-11-02 DIAGNOSIS — Z79899 Other long term (current) drug therapy: Secondary | ICD-10-CM | POA: Diagnosis not present

## 2021-11-02 DIAGNOSIS — Z0000 Encounter for general adult medical examination without abnormal findings: Secondary | ICD-10-CM | POA: Diagnosis not present

## 2021-11-11 DIAGNOSIS — M6281 Muscle weakness (generalized): Secondary | ICD-10-CM | POA: Diagnosis not present

## 2021-11-11 DIAGNOSIS — M25511 Pain in right shoulder: Secondary | ICD-10-CM | POA: Diagnosis not present

## 2021-11-11 DIAGNOSIS — M25611 Stiffness of right shoulder, not elsewhere classified: Secondary | ICD-10-CM | POA: Diagnosis not present

## 2021-11-12 DIAGNOSIS — D1801 Hemangioma of skin and subcutaneous tissue: Secondary | ICD-10-CM | POA: Diagnosis not present

## 2021-11-12 DIAGNOSIS — L821 Other seborrheic keratosis: Secondary | ICD-10-CM | POA: Diagnosis not present

## 2021-11-12 DIAGNOSIS — L814 Other melanin hyperpigmentation: Secondary | ICD-10-CM | POA: Diagnosis not present

## 2021-11-16 DIAGNOSIS — N6001 Solitary cyst of right breast: Secondary | ICD-10-CM | POA: Diagnosis not present

## 2021-11-16 DIAGNOSIS — R922 Inconclusive mammogram: Secondary | ICD-10-CM | POA: Diagnosis not present

## 2021-11-16 DIAGNOSIS — N6489 Other specified disorders of breast: Secondary | ICD-10-CM | POA: Diagnosis not present

## 2021-11-26 DIAGNOSIS — M6281 Muscle weakness (generalized): Secondary | ICD-10-CM | POA: Diagnosis not present

## 2021-11-26 DIAGNOSIS — M25511 Pain in right shoulder: Secondary | ICD-10-CM | POA: Diagnosis not present

## 2021-11-26 DIAGNOSIS — M25611 Stiffness of right shoulder, not elsewhere classified: Secondary | ICD-10-CM | POA: Diagnosis not present

## 2021-11-26 DIAGNOSIS — M542 Cervicalgia: Secondary | ICD-10-CM | POA: Diagnosis not present

## 2021-12-04 DIAGNOSIS — M25511 Pain in right shoulder: Secondary | ICD-10-CM | POA: Diagnosis not present

## 2021-12-04 DIAGNOSIS — M25611 Stiffness of right shoulder, not elsewhere classified: Secondary | ICD-10-CM | POA: Diagnosis not present

## 2021-12-04 DIAGNOSIS — M542 Cervicalgia: Secondary | ICD-10-CM | POA: Diagnosis not present

## 2021-12-04 DIAGNOSIS — M6281 Muscle weakness (generalized): Secondary | ICD-10-CM | POA: Diagnosis not present

## 2021-12-08 DIAGNOSIS — M25611 Stiffness of right shoulder, not elsewhere classified: Secondary | ICD-10-CM | POA: Diagnosis not present

## 2021-12-08 DIAGNOSIS — M6281 Muscle weakness (generalized): Secondary | ICD-10-CM | POA: Diagnosis not present

## 2021-12-08 DIAGNOSIS — M25511 Pain in right shoulder: Secondary | ICD-10-CM | POA: Diagnosis not present

## 2021-12-08 DIAGNOSIS — M542 Cervicalgia: Secondary | ICD-10-CM | POA: Diagnosis not present

## 2021-12-14 DIAGNOSIS — M25511 Pain in right shoulder: Secondary | ICD-10-CM | POA: Diagnosis not present

## 2021-12-14 DIAGNOSIS — M542 Cervicalgia: Secondary | ICD-10-CM | POA: Diagnosis not present

## 2021-12-14 DIAGNOSIS — M25611 Stiffness of right shoulder, not elsewhere classified: Secondary | ICD-10-CM | POA: Diagnosis not present

## 2021-12-14 DIAGNOSIS — M6281 Muscle weakness (generalized): Secondary | ICD-10-CM | POA: Diagnosis not present

## 2021-12-30 DIAGNOSIS — M25511 Pain in right shoulder: Secondary | ICD-10-CM | POA: Diagnosis not present

## 2021-12-30 DIAGNOSIS — M542 Cervicalgia: Secondary | ICD-10-CM | POA: Diagnosis not present

## 2021-12-30 DIAGNOSIS — M6281 Muscle weakness (generalized): Secondary | ICD-10-CM | POA: Diagnosis not present

## 2021-12-30 DIAGNOSIS — M25611 Stiffness of right shoulder, not elsewhere classified: Secondary | ICD-10-CM | POA: Diagnosis not present

## 2022-01-21 DIAGNOSIS — M25511 Pain in right shoulder: Secondary | ICD-10-CM | POA: Diagnosis not present

## 2022-01-21 DIAGNOSIS — M542 Cervicalgia: Secondary | ICD-10-CM | POA: Diagnosis not present

## 2022-01-21 DIAGNOSIS — M25611 Stiffness of right shoulder, not elsewhere classified: Secondary | ICD-10-CM | POA: Diagnosis not present

## 2022-01-21 DIAGNOSIS — M6281 Muscle weakness (generalized): Secondary | ICD-10-CM | POA: Diagnosis not present

## 2022-02-02 DIAGNOSIS — M25511 Pain in right shoulder: Secondary | ICD-10-CM | POA: Diagnosis not present

## 2022-02-02 DIAGNOSIS — M542 Cervicalgia: Secondary | ICD-10-CM | POA: Diagnosis not present

## 2022-02-02 DIAGNOSIS — M6281 Muscle weakness (generalized): Secondary | ICD-10-CM | POA: Diagnosis not present

## 2022-02-02 DIAGNOSIS — M25611 Stiffness of right shoulder, not elsewhere classified: Secondary | ICD-10-CM | POA: Diagnosis not present

## 2022-02-17 DIAGNOSIS — M6281 Muscle weakness (generalized): Secondary | ICD-10-CM | POA: Diagnosis not present

## 2022-02-17 DIAGNOSIS — M25511 Pain in right shoulder: Secondary | ICD-10-CM | POA: Diagnosis not present

## 2022-02-17 DIAGNOSIS — M542 Cervicalgia: Secondary | ICD-10-CM | POA: Diagnosis not present

## 2022-02-17 DIAGNOSIS — M25611 Stiffness of right shoulder, not elsewhere classified: Secondary | ICD-10-CM | POA: Diagnosis not present

## 2022-03-10 DIAGNOSIS — M25611 Stiffness of right shoulder, not elsewhere classified: Secondary | ICD-10-CM | POA: Diagnosis not present

## 2022-03-10 DIAGNOSIS — M25511 Pain in right shoulder: Secondary | ICD-10-CM | POA: Diagnosis not present

## 2022-03-10 DIAGNOSIS — M6281 Muscle weakness (generalized): Secondary | ICD-10-CM | POA: Diagnosis not present

## 2022-03-10 DIAGNOSIS — M542 Cervicalgia: Secondary | ICD-10-CM | POA: Diagnosis not present

## 2022-03-31 DIAGNOSIS — M25511 Pain in right shoulder: Secondary | ICD-10-CM | POA: Diagnosis not present

## 2022-03-31 DIAGNOSIS — M6281 Muscle weakness (generalized): Secondary | ICD-10-CM | POA: Diagnosis not present

## 2022-03-31 DIAGNOSIS — M25611 Stiffness of right shoulder, not elsewhere classified: Secondary | ICD-10-CM | POA: Diagnosis not present

## 2022-03-31 DIAGNOSIS — M542 Cervicalgia: Secondary | ICD-10-CM | POA: Diagnosis not present

## 2022-04-18 DIAGNOSIS — Z20822 Contact with and (suspected) exposure to covid-19: Secondary | ICD-10-CM | POA: Diagnosis not present

## 2022-04-18 DIAGNOSIS — J00 Acute nasopharyngitis [common cold]: Secondary | ICD-10-CM | POA: Diagnosis not present

## 2022-04-18 DIAGNOSIS — U071 COVID-19: Secondary | ICD-10-CM | POA: Diagnosis not present

## 2022-04-18 DIAGNOSIS — J069 Acute upper respiratory infection, unspecified: Secondary | ICD-10-CM | POA: Diagnosis not present

## 2022-05-05 DIAGNOSIS — M25611 Stiffness of right shoulder, not elsewhere classified: Secondary | ICD-10-CM | POA: Diagnosis not present

## 2022-05-05 DIAGNOSIS — M25511 Pain in right shoulder: Secondary | ICD-10-CM | POA: Diagnosis not present

## 2022-05-05 DIAGNOSIS — M542 Cervicalgia: Secondary | ICD-10-CM | POA: Diagnosis not present

## 2022-05-05 DIAGNOSIS — M6281 Muscle weakness (generalized): Secondary | ICD-10-CM | POA: Diagnosis not present

## 2022-05-06 DIAGNOSIS — R197 Diarrhea, unspecified: Secondary | ICD-10-CM | POA: Diagnosis not present

## 2024-02-14 ENCOUNTER — Ambulatory Visit
Admission: RE | Admit: 2024-02-14 | Discharge: 2024-02-14 | Disposition: A | Discharge status: Home / Self Care | Source: Ambulatory Visit | Attending: Family Medicine | Admitting: Family Medicine

## 2024-02-14 ENCOUNTER — Ambulatory Visit

## 2024-02-14 VITALS — BP 130/61 | HR 95 | Temp 98.7°F | Resp 16

## 2024-02-14 DIAGNOSIS — M25532 Pain in left wrist: Secondary | ICD-10-CM

## 2024-02-14 DIAGNOSIS — W19XXXA Unspecified fall, initial encounter: Secondary | ICD-10-CM

## 2024-02-14 DIAGNOSIS — M25522 Pain in left elbow: Secondary | ICD-10-CM | POA: Diagnosis not present

## 2024-02-14 MED ORDER — CELECOXIB 200 MG PO CAPS: 200.0000 mg | ORAL_CAPSULE | Freq: Every day | ORAL | 0 refills | Status: AC

## 2024-02-14 NOTE — ED Triage Notes (Signed)
 Pt presents to [facility] with co fall while on lunch this afternoon. Pt reports she slipped on a grate and injured left elbow, and wrist. Pt reports she has ice the area but thinks she may need xray.

## 2024-02-14 NOTE — ED Provider Notes (Signed)
 [facility]    CSN: [id number] Arrival date & time: 02/14/24  1448      History   Chief Complaint Chief Complaint  Patient presents with   Arm Injury    Entered by patient   Fall    HPI Glenda Powell is a 53 y.o. female.   HPI 53 year old female presents with left elbow injury secondary to fall at lunch this afternoon.  PMH significant for thyroid  disease.  Past Medical History:  Diagnosis Date   Thyroid  disease     Patient Active Problem List   Diagnosis Date Noted   Adhesive capsulitis of right shoulder 08/20/2021   Right knee injury 01/05/2021    Past Surgical History:  Procedure Laterality Date   HYSTERECTOMY ABDOMINAL WITH SALPINGO-OOPHORECTOMY     TYMPANOSTOMY TUBE PLACEMENT      OB History   No obstetric history on file.      Home Medications    Prior to Admission medications   Medication Sig Start Date End Date Taking? Authorizing Provider  celecoxib (CELEBREX) 200 MG capsule Take 1 capsule (200 mg total) by mouth daily for 15 days. 02/14/24 02/29/24 Yes Leonides Ramp, FNP  estradiol (VIVELLE-DOT) 0.05 MG/24HR patch Place one patch on skin two times per week. 10/10/18   [provider]  SYNTHROID 137 MCG tablet Take 137 mcg by mouth daily. 08/02/19   [provider]    Family History Family History  Problem Relation Age of Onset   Healthy Mother    Healthy Father     Social History Social History   Tobacco Use   Smoking status: Never   Smokeless tobacco: Never  Vaping Use   Vaping status: Never Used     Allergies   Codeine, Escitalopram, Sertraline, and Levaquin [levofloxacin]   Review of Systems Review of Systems   Physical Exam Triage Vital Signs ED Triage Vitals  Encounter Vitals Group     BP 02/14/24 1513 130/61     Systolic BP Percentile --      Diastolic BP Percentile --      Pulse Rate 02/14/24 1511 95     Resp 02/14/24 1511 16     Temp 02/14/24 1511 98.7 F (37.1 C)     Temp src --       SpO2 02/14/24 1511 98 %     Weight --      Height --      Head Circumference --      Peak Flow --      Pain Score 02/14/24 1509 2     Pain Loc --      Pain Education --      Exclude from Growth Chart --    No data found.  Updated Vital Signs BP 130/61   Pulse 95   Temp 98.7 F (37.1 C)   Resp 16   LMP  (LMP Unknown)   SpO2 98%    Physical Exam Vitals and nursing note reviewed.  Constitutional:      Appearance: Normal appearance. She is normal weight.  HENT:     Head: Normocephalic and atraumatic.     Mouth/Throat:     Mouth: Mucous membranes are moist.     Pharynx: Oropharynx is clear.  Eyes:     Extraocular Movements: Extraocular movements intact.     Conjunctiva/sclera: Conjunctivae normal.     Pupils: Pupils are equal, round, and reactive to light.  Cardiovascular:     Rate and Rhythm: Normal rate and  regular rhythm.     Pulses: Normal pulses.     Heart sounds: Normal heart sounds.  Pulmonary:     Effort: Pulmonary effort is normal.     Breath sounds: Normal breath sounds. No wheezing, rhonchi or rales.  Musculoskeletal:        General: Normal range of motion.     Cervical back: Normal range of motion and neck supple.     Comments: Left elbow (posterior volar aspect): Moderate soft tissue swelling with mild ecchymosis noted  Left wrist (dorsum): TTP over distal radius, with mild soft tissue swelling noted  Skin:    General: Skin is warm and dry.  Neurological:     General: No focal deficit present.     Mental Status: She is alert and oriented to person, place, and time. Mental status is at baseline.  Psychiatric:        Mood and Affect: Mood normal.        Behavior: Behavior normal.      UC Treatments / Results  Labs (all labs ordered are listed, but only abnormal results are displayed) Labs Reviewed - No data to display  EKG   Radiology DG Elbow Complete Left Result Date: 02/14/2024 CLINICAL DATA:  Elbow pain after injury. EXAM: LEFT ELBOW -  COMPLETE 3+ VIEW COMPARISON:  None Available. FINDINGS: There is no evidence of fracture, dislocation, or joint effusion. Alignment is maintained. There is no evidence of arthropathy or other focal bone abnormality. Soft tissues are unremarkable. IMPRESSION: Negative radiographs of the left elbow. Electronically Signed   By: Chadwick Colonel M.D.   On: 02/14/2024 16:11   DG Wrist Complete Left Result Date: 02/14/2024 CLINICAL DATA:  Left wrist pain after injury. EXAM: LEFT WRIST - COMPLETE 3+ VIEW COMPARISON:  None Available. FINDINGS: There is no evidence of fracture or dislocation. Mild osteoarthritis of the thumb carpal metacarpal joint. Soft tissues are unremarkable. IMPRESSION: 1. No fracture or subluxation of the left wrist. 2. 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 Electronically Signed   By: Chadwick Colonel M.D.   On: 02/14/2024 16:10    Procedures Procedures (including critical care time)  Medications Ordered in UC Medications - No data to display  Initial Impression / Assessment and Plan / UC Course  I have reviewed the triage vital signs and the nursing notes.  Pertinent labs & imaging results that were available during my care of the patient were reviewed by me and considered in my medical decision making (see chart for details).     MDM: 1.  Left elbow pain-left elbow x-ray results revealed above, left elbow wrapped with Ace bandage prior to discharge today, Rx'd Celebrex 200 mg capsule: Take 1 capsule daily x 15 days; 2.  Left wrist pain-left wrist x-ray results revealed above, Rx'd Celebrex 200 mg capsule: Take 1 capsule daily x 15 days; 3.  Fall, initial encounter-patient reports inadvertently slipping on strom grate in the rain at lunch today. Advised patient of x-ray results with hardcopy and images provided.  Advised may RICE affected areas of left wrist and left elbow for 30 minutes 3 times daily for the next 3 days.  Advised patient to take medication as  directed with food to completion.  Encouraged to increase daily water intake to 64 ounces per day while taking this medication.  Advised if symptoms worsen and/or unresolved please follow-up with your PCP or [facility] orthopedics for further evaluation.  Patient discharged home, hemodynamically stable. Final Clinical Impressions(s) / UC  Diagnoses   Final diagnoses:  Fall, initial encounter  Left elbow pain  Left wrist pain     Discharge Instructions      Advised patient of x-ray results with hardcopy and images provided.  Advised may RICE affected areas of left wrist and left elbow for 30 minutes 3 times daily for the next 3 days.  Advised patient to take medication as directed with food to completion.  Encouraged to increase daily water intake to 64 ounces per day while taking this medication.  Advised if symptoms worsen and/or unresolved please follow-up with your PCP or [facility] orthopedics for further evaluation.   ED Prescriptions     Medication Sig Dispense Auth. Provider   celecoxib (CELEBREX) 200 MG capsule Take 1 capsule (200 mg total) by mouth daily for 15 days. 15 capsule Loys Shugars, FNP      PDMP not reviewed this encounter.   Leonides Ramp, FNP 02/14/24 [phone]

## 2024-02-14 NOTE — Discharge Instructions (Addendum)
 Advised patient of x-ray results with hardcopy and images provided.  Advised may RICE affected areas of left wrist and left elbow for 30 minutes 3 times daily for the next 3 days.  Advised patient to take medication as directed with food to completion.  Encouraged to increase daily water intake to 64 ounces per day while taking this medication.  Advised if symptoms worsen and/or unresolved please follow-up with your PCP or [facility] orthopedics for further evaluation.

## 2024-05-22 ENCOUNTER — Encounter: Payer: Self-pay | Admitting: Sports Medicine
